# Patient Record
Sex: Female | Born: 1980 | Race: White | Hispanic: No | Marital: Single | Smoking: Current every day smoker
Health system: Southern US, Community
[De-identification: ages and names within clinical notes are randomized; demographics above are authoritative.]

## PROBLEM LIST (undated history)

## (undated) DIAGNOSIS — G500 Trigeminal neuralgia: Secondary | ICD-10-CM

## (undated) HISTORY — PX: CARDIAC ELECTROPHYSIOLOGY MAPPING AND ABLATION: SHX1292

---

## 2016-07-06 ENCOUNTER — Emergency Department (HOSPITAL_COMMUNITY): Payer: PRIVATE HEALTH INSURANCE

## 2016-07-06 ENCOUNTER — Encounter (HOSPITAL_COMMUNITY): Payer: Self-pay | Admitting: Emergency Medicine

## 2016-07-06 ENCOUNTER — Emergency Department (HOSPITAL_COMMUNITY)
Admission: EM | Admit: 2016-07-06 | Discharge: 2016-07-06 | Disposition: A | Discharge status: Home / Self Care | Payer: PRIVATE HEALTH INSURANCE | Attending: Emergency Medicine | Admitting: Emergency Medicine

## 2016-07-06 DIAGNOSIS — F17210 Nicotine dependence, cigarettes, uncomplicated: Secondary | ICD-10-CM | POA: Insufficient documentation

## 2016-07-06 DIAGNOSIS — K7689 Other specified diseases of liver: Secondary | ICD-10-CM | POA: Insufficient documentation

## 2016-07-06 DIAGNOSIS — R109 Unspecified abdominal pain: Secondary | ICD-10-CM

## 2016-07-06 DIAGNOSIS — R7401 Elevation of levels of liver transaminase levels: Secondary | ICD-10-CM

## 2016-07-06 DIAGNOSIS — R740 Nonspecific elevation of levels of transaminase and lactic acid dehydrogenase [LDH]: Secondary | ICD-10-CM | POA: Insufficient documentation

## 2016-07-06 DIAGNOSIS — R1011 Right upper quadrant pain: Secondary | ICD-10-CM

## 2016-07-06 HISTORY — DX: Trigeminal neuralgia: G50.0

## 2016-07-06 LAB — COMPREHENSIVE METABOLIC PANEL
ALK PHOS: 69 U/L (ref 38–126)
ALT: 51 U/L (ref 14–54)
AST: 53 U/L — ABNORMAL HIGH (ref 15–41)
Albumin: 4.1 g/dL (ref 3.5–5.0)
Anion gap: 10 (ref 5–15)
BILIRUBIN TOTAL: 0.2 mg/dL — AB (ref 0.3–1.2)
BUN: 10 mg/dL (ref 6–20)
CALCIUM: 9.2 mg/dL (ref 8.9–10.3)
CO2: 25 mmol/L (ref 22–32)
CREATININE: 0.59 mg/dL (ref 0.44–1.00)
Chloride: 104 mmol/L (ref 101–111)
Glucose, Bld: 84 mg/dL (ref 65–99)
Potassium: 3.7 mmol/L (ref 3.5–5.1)
Sodium: 139 mmol/L (ref 135–145)
Total Protein: 7.3 g/dL (ref 6.5–8.1)

## 2016-07-06 LAB — CBC WITH DIFFERENTIAL/PLATELET
Basophils Absolute: 0 10*3/uL (ref 0.0–0.1)
Basophils Relative: 0 %
EOS ABS: 0.1 10*3/uL (ref 0.0–0.7)
EOS PCT: 1 %
HCT: 38.6 % (ref 36.0–46.0)
Hemoglobin: 13.5 g/dL (ref 12.0–15.0)
LYMPHS ABS: 3.1 10*3/uL (ref 0.7–4.0)
Lymphocytes Relative: 21 %
MCH: 33.8 pg (ref 26.0–34.0)
MCHC: 35 g/dL (ref 30.0–36.0)
MCV: 96.5 fL (ref 78.0–100.0)
Monocytes Absolute: 1.5 10*3/uL — ABNORMAL HIGH (ref 0.1–1.0)
Monocytes Relative: 10 %
Neutro Abs: 10 10*3/uL — ABNORMAL HIGH (ref 1.7–7.7)
Neutrophils Relative %: 68 %
PLATELETS: 187 10*3/uL (ref 150–400)
RBC: 4 MIL/uL (ref 3.87–5.11)
RDW: 12.7 % (ref 11.5–15.5)
WBC: 14.8 10*3/uL — AB (ref 4.0–10.5)

## 2016-07-06 LAB — URINALYSIS, ROUTINE W REFLEX MICROSCOPIC
BILIRUBIN URINE: NEGATIVE
Bacteria, UA: NONE SEEN
GLUCOSE, UA: NEGATIVE mg/dL
Ketones, ur: NEGATIVE mg/dL
NITRITE: NEGATIVE
PH: 6 (ref 5.0–8.0)
Protein, ur: NEGATIVE mg/dL
SPECIFIC GRAVITY, URINE: 1.002 — AB (ref 1.005–1.030)
Squamous Epithelial / LPF: NONE SEEN

## 2016-07-06 LAB — POC URINE PREG, ED: Preg Test, Ur: NEGATIVE

## 2016-07-06 LAB — LIPASE, BLOOD: Lipase: 18 U/L (ref 11–51)

## 2016-07-06 MED ORDER — MORPHINE SULFATE (PF) 4 MG/ML IV SOLN
4.0000 mg | Freq: Once | INTRAVENOUS | Status: AC
  Administered 2016-07-06: 4 mg via INTRAVENOUS
  Filled 2016-07-06: qty 1

## 2016-07-06 MED ORDER — ONDANSETRON HCL 4 MG PO TABS: 4.0000 mg | ORAL_TABLET | Freq: Four times a day (QID) | ORAL | 0 refills | Status: AC | PRN

## 2016-07-06 MED ORDER — ONDANSETRON HCL 4 MG/2ML IJ SOLN
4.0000 mg | Freq: Once | INTRAMUSCULAR | Status: AC
  Administered 2016-07-06: 4 mg via INTRAVENOUS
  Filled 2016-07-06: qty 2

## 2016-07-06 MED ORDER — NAPROXEN 500 MG PO TABS: 500.0000 mg | ORAL_TABLET | Freq: Two times a day (BID) | ORAL | 0 refills | Status: AC

## 2016-07-06 MED ORDER — KETOROLAC TROMETHAMINE 30 MG/ML IJ SOLN
30.0000 mg | Freq: Once | INTRAMUSCULAR | Status: AC
  Administered 2016-07-06: 30 mg via INTRAVENOUS
  Filled 2016-07-06: qty 1

## 2016-07-06 MED ORDER — OXYCODONE-ACETAMINOPHEN 5-325 MG PO TABS: 1.0000 | ORAL_TABLET | ORAL | 0 refills | Status: AC | PRN

## 2016-07-06 NOTE — Discharge Instructions (Signed)
Your ultrasound showed a nodule in your liver (not seen on the CT scan). The radiologist recommends an MRI scan to further evaluate it. That can be set up as an outpatient.

## 2016-07-06 NOTE — ED Triage Notes (Signed)
Pt reports having right flank pain that began over the weekend and also reports cough that has been ongoing for the last 3 weeks.

## 2016-07-06 NOTE — ED Provider Notes (Signed)
[facility]-EMERGENCY DEPT Provider Note   CSN: [id number] Arrival date & time: 07/06/16  4098     History   Chief Complaint Chief Complaint  Patient presents with  . Flank Pain  . URI    HPI Kristi Glover is a 36 y.o. female.  She comes in tonight because of severe right upper quadrant and flank pain. She has been sick for the last 3 weeks. Initially, she had several days of fever up to 103.5. She's had a persistent cough during this time. 2 days ago, she started having pain in the right upper abdomen and right flank but it got severe tonight. Currently pain is rated at 9/10. Is worse with movement and worse with breathing of. She has had nausea and vomiting. She does relate that a proximally 4 months ago she was told that her liver enzymes were mildly elevated but she never followed up for further testing. She had been taking ibuprofen for fever and aching but has run out. She denies any difficulty urinating.   The history is provided by the patient.    Past Medical History:  Diagnosis Date  . Trigeminal neuralgia     There are no active problems to display for this patient.   Past Surgical History:  Procedure Laterality Date  . CARDIAC ELECTROPHYSIOLOGY MAPPING AND ABLATION     as a child  . CESAREAN SECTION  2005    OB History    No data available       Home Medications    Prior to Admission medications   Medication Sig Start Date End Date Taking? Authorizing Provider  ibuprofen (ADVIL,MOTRIN) 200 MG tablet Take 400 mg by mouth every 6 (six) hours as needed.   Yes Historical Provider, MD    Family History History reviewed. No pertinent family history.  Social History Social History  Substance Use Topics  . Smoking status: Current Every Day Smoker    Packs/day: 0.50    Types: Cigarettes  . Smokeless tobacco: Never Used  . Alcohol use 4.2 oz/week    7 Glasses of wine per week     Allergies   Almond oil   Review of Systems Review of Systems  All  other systems reviewed and are negative.    Physical Exam Updated Vital Signs BP 96/66 (BP Location: Left Arm)   Pulse 66   Temp 97.5 F (36.4 C) (Oral)   Resp 17   Ht 5\' 4"  (1.626 m)   Wt 160 lb (72.6 kg)   LMP 06/26/2016   SpO2 99%   BMI 27.46 kg/m   Physical Exam  Nursing note and vitals reviewed.  36 year old female, appears uncomfortab, but is in no acute distress. Vital signs are normal. Oxygen saturation is 99%, which is normal. Head is normocephalic and atraumatic. PERRLA, EOMI. Oropharynx is clear. Neck is nontender and supple without adenopathy or JVD. Back is nontender in the midline. There is mild to moderate right CVA tenderness. Lungs are clear without rales, wheezes, or rhonchi. Chest is nontender. Heart has regular rate and rhythm without murmur. Abdomen is soft, flat, with marked right upper quadrant tenderness. There is no rebound or guarding. There are no without masses or hepatosplenomegaly and peristalsis is hypoactive. Extremities have no cyanosis or edema, full range of motion is present. Skin is warm and dry without rash. Neurologic: Mental status is normal, cranial nerves are intact, there are no motor or sensory deficits.   ED Treatments / Results  Labs (all  labs ordered are listed, but only abnormal results are displayed) Labs Reviewed  URINALYSIS, ROUTINE W REFLEX MICROSCOPIC - Abnormal; Notable for the following:       Result Value   Color, Urine COLORLESS (*)    Specific Gravity, Urine 1.002 (*)    Hgb urine dipstick SMALL (*)    Leukocytes, UA TRACE (*)    All other components within normal limits  COMPREHENSIVE METABOLIC PANEL - Abnormal; Notable for the following:    AST 53 (*)    Total Bilirubin 0.2 (*)    All other components within normal limits  CBC WITH DIFFERENTIAL/PLATELET - Abnormal; Notable for the following:    WBC 14.8 (*)    Neutro Abs 10.0 (*)    Monocytes Absolute 1.5 (*)    All other components within normal limits    LIPASE, BLOOD  POC URINE PREG, ED     Radiology Dg Chest 2 View  Result Date: 07/06/2016 CLINICAL DATA:  Cough and flu-like symptoms for 1 week. RIGHT flank pain. EXAM: CHEST  2 VIEW COMPARISON:  None. FINDINGS: Cardiomediastinal silhouette is normal. No pleural effusions or focal consolidations. Trachea projects midline and there is no pneumothorax. Soft tissue planes and included osseous structures are non-suspicious. IMPRESSION: Normal chest. Electronically Signed   By: Awilda Metroourtnay  Bloomer M.D.   On: 07/06/2016 05:34   [place] Abdomen Limited  Result Date: 07/06/2016 CLINICAL DATA:  Right upper quadrant pain for 2 days EXAM: US ABDOMEN LIMITED - RIGHT UPPER QUADRANT COMPARISON:  07/06/2016 FINDINGS: Gallbladder: No gallstones or wall thickening visualized. No sonographic Murphy sign noted by sonographer. Common bile duct: Diameter: 5.4 mm. Liver: Slight increased echogenicity consistent with fatty infiltration. A focal 9 mm hypoechoic structure is noted in the midportion of the right lobe of the liver. This was not well appreciated on recent noncontrast CT. No other focal lesion is noted. IMPRESSION: 9 mm hypoechoic structure in the right lobe of the liver. This was not well appreciated on recent CT examination. Nonemergent MRI is recommended for further workup. Electronically Signed   By: Alcide CleverMark  Lukens M.D.   On: 07/06/2016 08:14   Ct Renal Stone Study  Result Date: 07/06/2016 CLINICAL DATA:  RIGHT flank pain for 5 days. EXAM: CT ABDOMEN AND PELVIS WITHOUT CONTRAST TECHNIQUE: Multidetector CT imaging of the abdomen and pelvis was performed following the standard protocol without IV contrast. COMPARISON:  None. FINDINGS: LOWER CHEST: Lung bases are clear. The visualized heart size is normal. No pericardial effusion. HEPATOBILIARY: Focal fatty infiltration about the falciform ligament, liver is otherwise unremarkable. Normal gallbladder. PANCREAS: Normal. SPLEEN: Normal. ADRENALS/URINARY TRACT: Kidneys are  orthotopic, demonstrating normal size and morphology. No nephrolithiasis, hydronephrosis; limited assessment for renal masses on this nonenhanced examination. The unopacified ureters are normal in course and caliber. Urinary bladder is partially distended and unremarkable. Normal adrenal glands. STOMACH/BOWEL: The stomach, small and large bowel are normal in course and caliber without inflammatory changes, sensitivity decreased by lack of enteric contrast. Normal appendix. VASCULAR/LYMPHATIC: Aortoiliac vessels are normal in course and caliber. No lymphadenopathy by CT size criteria. REPRODUCTIVE: 4.5 cm benign-appearing RIGHT adnexal cyst ; no indicated follow-up. This recommendation follows ACR consensus guidelines: White Paper of the ACR Incidental Findings Committee II on Adnexal Findings. J Am Coll Radiol [id number]. OTHER: No intraperitoneal free fluid or free air. Phleboliths in the pelvis. MUSCULOSKELETAL: Non-acute.  Anterior pelvic wall scarring. IMPRESSION: No urolithiasis, obstructive uropathy nor acute intra-abdominal/ pelvic process. Normal appendix. Electronically Signed   By: Awilda Metroourtnay  Bloomer  M.D.   On: 07/06/2016 05:30    Procedures Procedures (including critical care time)  Medications Ordered in ED Medications  ondansetron (ZOFRAN) injection 4 mg (4 mg Intravenous Given 07/06/16 0459)  ketorolac (TORADOL) 30 MG/ML injection 30 mg (30 mg Intravenous Given 07/06/16 0459)  morphine 4 MG/ML injection 4 mg (4 mg Intravenous Given 07/06/16 0459)     Initial Impression / Assessment and Plan / ED Course  I have reviewed the triage vital signs and the nursing notes.  Pertinent labs & imaging results that were available during my care of the patient were reviewed by me and considered in my medical decision making (see chart for details).  Right upper quadrant and flank pain. Consider biliary tract disease, renal calculus, musculoskeletal pain from persistent coughing, occult pneumonia.  Chest x-ray be obtained and she'll be sent for renal stone protocol CT scan. She is given ondansetron, morphine, and ketorolac. She has no old records and the [facility] system.  CT scan and chest x-ray are unremarkable. Laboratory workup shows minimal elevation of AST which is probably not clinically significant. She feels much better after receiving above noted medication. With her more comfortable, exam was repeated and she has clear right upper quadrant tenderness with positive Murphy sign. She is being sent for gallbladder ultrasound.  Ultrasound does not show any gallstones, but incidental finding of 9 mm hypoechoic liver nodule. Patient is advised of this finding as well as recommendation for nonemergent MRI scan. She is given referral to [facility] surgery for evaluation should pain consistent-consider outpatient HIDA scan. Prescriptions are given for naproxen and oxycodone-acetaminophen. Return precautions discussed.  Final Clinical Impressions(s) / ED Diagnoses   Final diagnoses:  Right flank pain  RUQ pain  Elevated transaminase level  Liver nodule    New Prescriptions New Prescriptions   NAPROXEN (NAPROSYN) 500 MG TABLET    Take 1 tablet (500 mg total) by mouth 2 (two) times daily.   OXYCODONE-ACETAMINOPHEN (PERCOCET) 5-325 MG TABLET    Take 1 tablet by mouth every 4 (four) hours as needed for moderate pain.     Dione Booze, MD 07/06/16 [phone]

## 2016-08-24 ENCOUNTER — Other Ambulatory Visit: Payer: Self-pay | Admitting: Family Medicine

## 2016-08-24 DIAGNOSIS — R935 Abnormal findings on diagnostic imaging of other abdominal regions, including retroperitoneum: Secondary | ICD-10-CM

## 2016-09-11 ENCOUNTER — Ambulatory Visit
Admission: RE | Admit: 2016-09-11 | Discharge: 2016-09-11 | Disposition: A | Discharge status: Home / Self Care | Payer: 59 | Source: Ambulatory Visit | Attending: Family Medicine | Admitting: Family Medicine

## 2016-09-11 DIAGNOSIS — R935 Abnormal findings on diagnostic imaging of other abdominal regions, including retroperitoneum: Secondary | ICD-10-CM

## 2016-09-11 MED ORDER — GADOXETATE DISODIUM 0.25 MMOL/ML IV SOLN
7.0000 mL | Freq: Once | INTRAVENOUS | Status: DC | PRN
Start: 2016-09-11 — End: 2016-09-12

## 2016-12-03 DEATH — deceased

## 2018-06-14 IMAGING — CR DG CHEST 2V
2 series · 2 of 2 positions shown · non-contrast
Comparison: None.

CLINICAL DATA: Cough and flu-like symptoms for 1 week. RIGHT flank
pain.

EXAM:
CHEST  2 VIEW

[w chest pa]
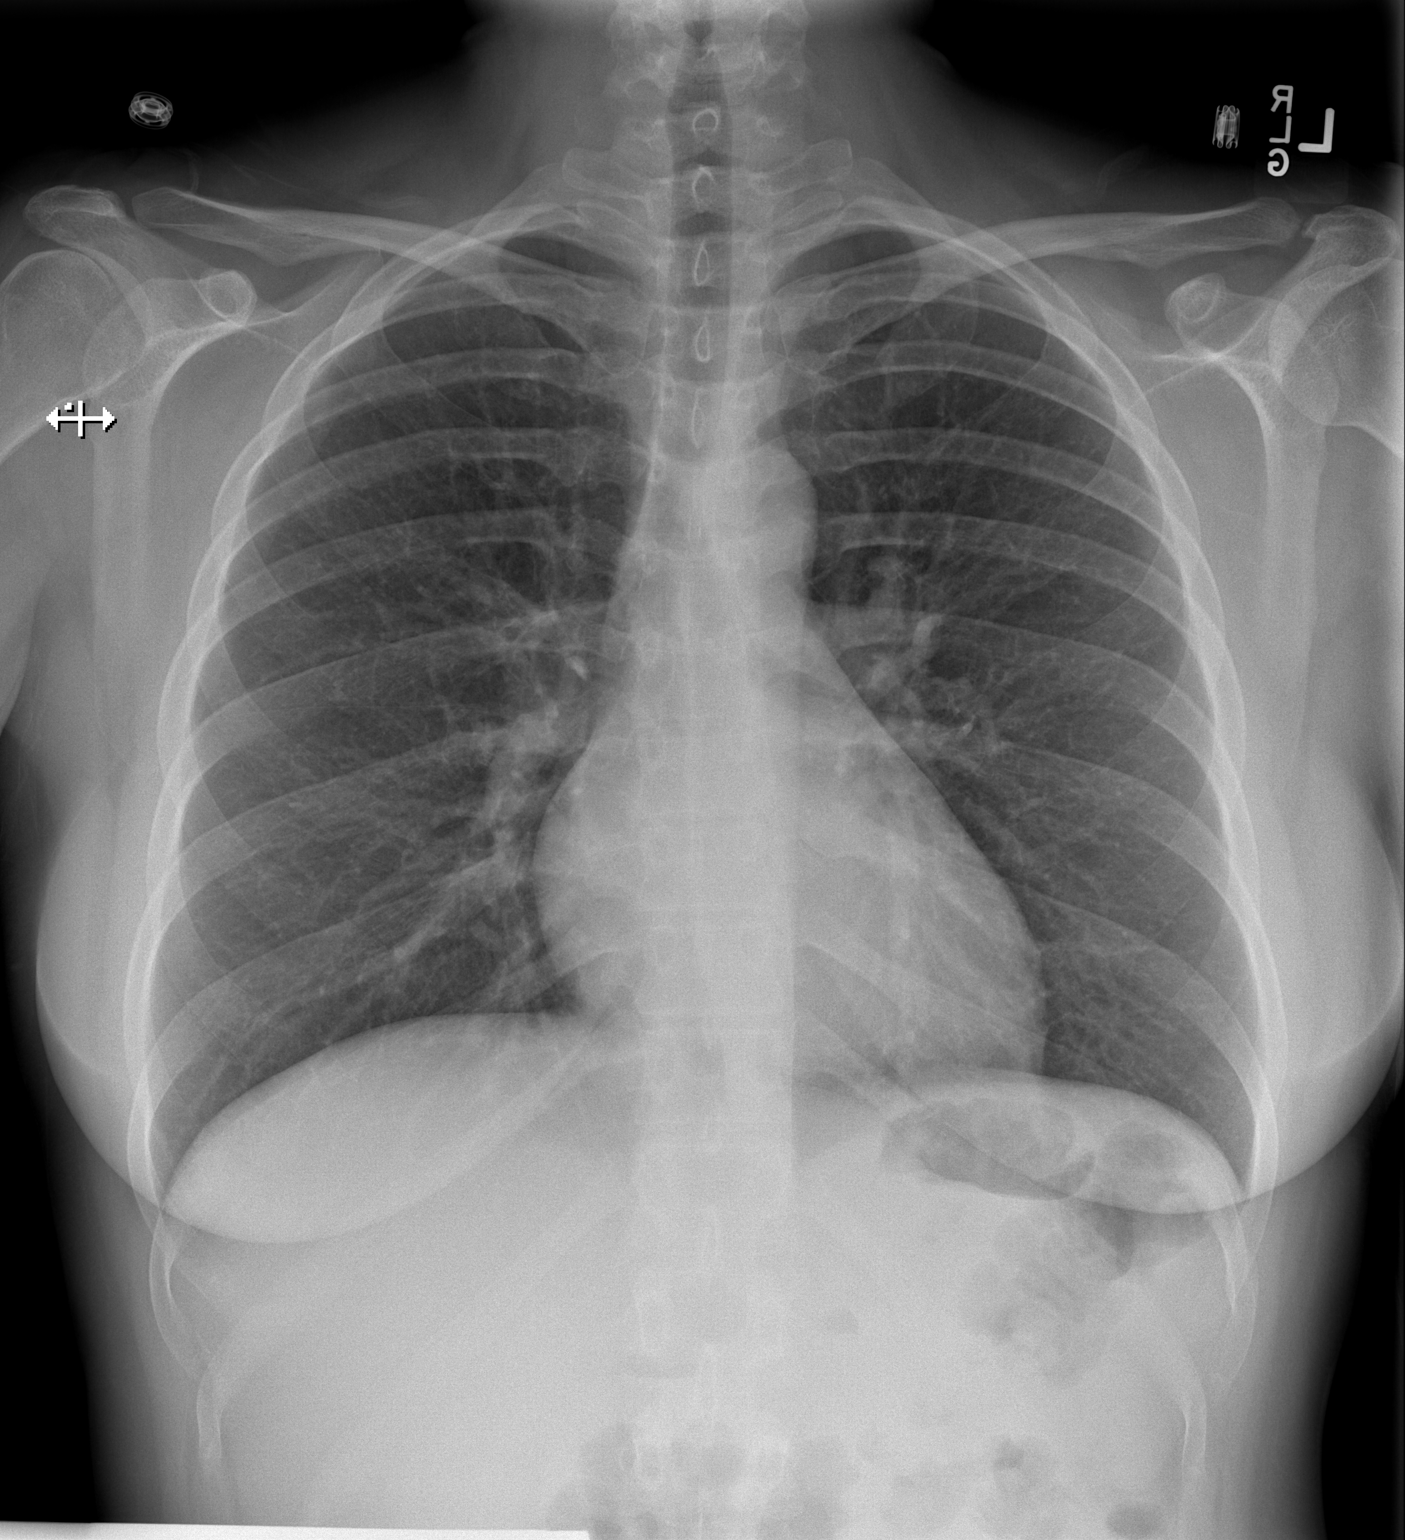

[w chest lat]
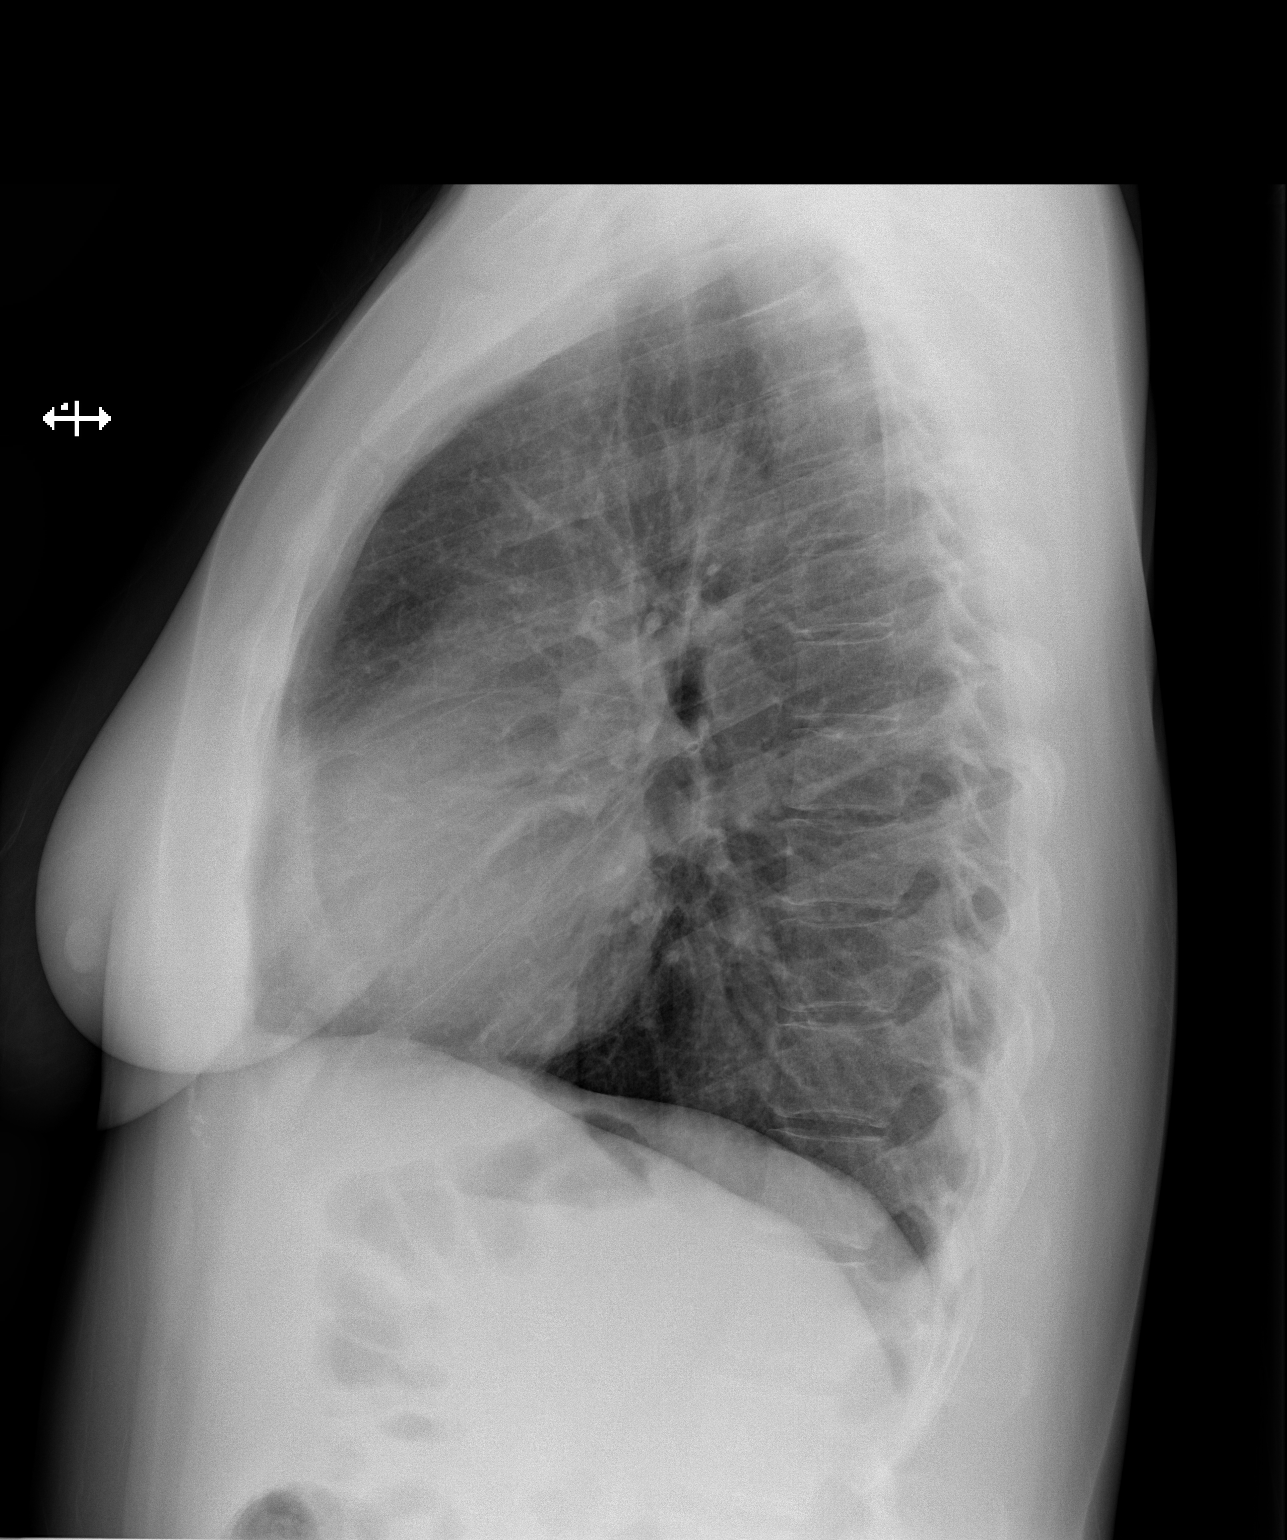

[2 of 2 positions shown; findings below may reference images not displayed]

FINDINGS: Cardiomediastinal silhouette is normal. No pleural effusions or
focal consolidations. Trachea projects midline and there is no
pneumothorax. Soft tissue planes and included osseous structures are
non-suspicious.
IMPRESSION: Normal chest.
